# Patient Record
Sex: Male | Born: 1955 | Race: White | Hispanic: No | Marital: Married | Smoking: Never smoker
Health system: Southern US, Community
[De-identification: ages and names within clinical notes are randomized; demographics above are authoritative.]

## PROBLEM LIST (undated history)

## (undated) DIAGNOSIS — N200 Calculus of kidney: Secondary | ICD-10-CM

## (undated) DIAGNOSIS — E0590 Thyrotoxicosis, unspecified without thyrotoxic crisis or storm: Secondary | ICD-10-CM

## (undated) DIAGNOSIS — I10 Essential (primary) hypertension: Secondary | ICD-10-CM

## (undated) HISTORY — PX: SHOULDER SURGERY: SHX246

---

## 2000-04-05 ENCOUNTER — Encounter: Payer: Self-pay | Admitting: Family Medicine

## 2000-04-05 ENCOUNTER — Ambulatory Visit (HOSPITAL_COMMUNITY): Admission: RE | Admit: 2000-04-05 | Discharge: 2000-04-05 | Payer: Self-pay | Admitting: Family Medicine

## 2000-04-12 ENCOUNTER — Encounter: Admission: RE | Admit: 2000-04-12 | Discharge: 2000-04-12 | Payer: Self-pay | Admitting: Family Medicine

## 2000-04-12 ENCOUNTER — Encounter: Payer: Self-pay | Admitting: Family Medicine

## 2002-02-04 ENCOUNTER — Encounter: Admission: RE | Admit: 2002-02-04 | Discharge: 2002-02-04 | Payer: Self-pay | Admitting: Family Medicine

## 2002-02-04 ENCOUNTER — Encounter: Payer: Self-pay | Admitting: Family Medicine

## 2002-02-06 ENCOUNTER — Encounter: Payer: Self-pay | Admitting: Family Medicine

## 2002-02-06 ENCOUNTER — Encounter: Admission: RE | Admit: 2002-02-06 | Discharge: 2002-02-06 | Payer: Self-pay | Admitting: Family Medicine

## 2003-06-04 ENCOUNTER — Emergency Department (HOSPITAL_COMMUNITY): Admission: EM | Admit: 2003-06-04 | Discharge: 2003-06-04 | Payer: Self-pay | Admitting: Emergency Medicine

## 2011-10-13 ENCOUNTER — Emergency Department (HOSPITAL_COMMUNITY)
Admission: EM | Admit: 2011-10-13 | Discharge: 2011-10-13 | Disposition: A | Discharge status: Home / Self Care | Payer: BC Managed Care – PPO | Attending: Emergency Medicine | Admitting: Emergency Medicine

## 2011-10-13 ENCOUNTER — Encounter (HOSPITAL_COMMUNITY): Payer: Self-pay | Admitting: Emergency Medicine

## 2011-10-13 DIAGNOSIS — M545 Low back pain: Secondary | ICD-10-CM

## 2011-10-13 DIAGNOSIS — E0590 Thyrotoxicosis, unspecified without thyrotoxic crisis or storm: Secondary | ICD-10-CM | POA: Insufficient documentation

## 2011-10-13 DIAGNOSIS — M5126 Other intervertebral disc displacement, lumbar region: Secondary | ICD-10-CM | POA: Insufficient documentation

## 2011-10-13 HISTORY — DX: Calculus of kidney: N20.0

## 2011-10-13 HISTORY — DX: Thyrotoxicosis, unspecified without thyrotoxic crisis or storm: E05.90

## 2011-10-13 MED ORDER — LORAZEPAM 2 MG/ML IJ SOLN
1.0000 mg | Freq: Once | INTRAMUSCULAR | Status: AC
  Administered 2011-10-13: 1 mg via INTRAMUSCULAR
  Filled 2011-10-13: qty 1

## 2011-10-13 MED ORDER — HYDROMORPHONE HCL PF 2 MG/ML IJ SOLN
2.0000 mg | Freq: Once | INTRAMUSCULAR | Status: AC
  Administered 2011-10-13: 2 mg via INTRAMUSCULAR
  Filled 2011-10-13: qty 1

## 2011-10-13 MED ORDER — DIAZEPAM 5 MG PO TABS: 5.0000 mg | ORAL_TABLET | Freq: Four times a day (QID) | ORAL | Status: AC | PRN

## 2011-10-13 MED ORDER — OXYCODONE-ACETAMINOPHEN 5-325 MG PO TABS: 2.0000 | ORAL_TABLET | ORAL | Status: AC | PRN

## 2011-10-13 NOTE — ED Provider Notes (Signed)
History   This chart was scribed for Hurman Horn, MD by Melba Coon. The patient was seen in room TR11C/TR11C and the patient's care was started at 5:05PM.    CSN: [id number]  Arrival date & time 10/13/11  1455   None     Chief Complaint  Patient presents with  . Back Pain    (Consider location/radiation/quality/duration/timing/severity/associated sxs/prior treatment) The history is provided by the patient. No language interpreter was used.   Howard Parks is a 56 y.o. male who presents to the Emergency Department complaining of constant, moderate to severe, non-radiating lower back pain with an onset about 4 hours ago. Pt was golfing today and may have pulled a muscle; felt a spasm. Heat, ice, and ibuprofen did not alleviate the symptoms. Certain body positions aggravate the pain. No HA, fever, neck pain, sore throat, rash, back pain, CP, SOB, abd pain, n/v/d, dysuria, bowel or bladder dysfunction, or extremity pain, edema, weakness, numbness, or tingling. No IV drug use. Allergic to Nyquil; Ampicillin; and Penicillins. No other pertinent medical symptoms.   Past Medical History  Diagnosis Date  . Kidney stones   . Hyperthyroidism     Past Surgical History  Procedure Date  . Shoulder surgery     No family history on file.  History  Substance Use Topics  . Smoking status: Never Smoker   . Smokeless tobacco: Not on file  . Alcohol Use: Yes      Review of Systems 10 Systems reviewed and all are negative for acute change except as noted in the HPI.   Allergies  Nyquil; Ampicillin; and Penicillins  Home Medications   Current Outpatient Rx  Name Route Sig Dispense Refill  . IBUPROFEN 800 MG PO TABS Oral Take 800 mg by mouth every 8 (eight) hours as needed.    Marland Kitchen LEVOTHYROXINE SODIUM 75 MCG PO TABS Oral Take 75 mcg by mouth daily.    Marland Kitchen DIAZEPAM 5 MG PO TABS Oral Take 1 tablet (5 mg total) by mouth every 6 (six) hours as needed (spasms). 10 tablet 0  .  OXYCODONE-ACETAMINOPHEN 5-325 MG PO TABS Oral Take 2 tablets by mouth every 4 (four) hours as needed for pain. 20 tablet 0    BP 137/70  Pulse 70  Temp 98.1 F (36.7 C) (Oral)  Resp 18  SpO2 95%  Physical Exam  Nursing note and vitals reviewed. Constitutional:       Awake, alert, nontoxic appearance with baseline speech.  HENT:  Head: Atraumatic.  Eyes: Pupils are equal, round, and reactive to light. Right eye exhibits no discharge. Left eye exhibits no discharge.  Neck: Neck supple.  Cardiovascular: Normal rate and regular rhythm.   No murmur heard. Pulmonary/Chest: Effort normal and breath sounds normal. No respiratory distress. He has no wheezes. He has no rales. He exhibits no tenderness.  Abdominal: Soft. Bowel sounds are normal. He exhibits no mass. There is no tenderness. There is no rebound.  Musculoskeletal:       Thoracic back: He exhibits no tenderness.       Lumbar back: He exhibits no tenderness.       Bilateral lower extremities non tender without new rashes or color change, baseline ROM with intact DP / PT pulses, CR<2 secs all digits bilaterally, sensation baseline light touch bilaterally for pt, DTR's symmetric and intact bilaterally KJ / AJ, motor symmetric bilateral 5 / 5 hip flexion, quadriceps, hamstrings, EHL, foot dorsiflexion, foot plantarflexion, gait somewhat antalgic but without apparent  new ataxia.  Neurological:       Mental status baseline for patient.  Upper extremity motor strength and sensation intact and symmetric bilaterally.  Skin: No rash noted.  Psychiatric: He has a normal mood and affect.    ED Course  Procedures (including critical care time)  DIAGNOSTIC STUDIES: Oxygen Saturation is 95% on room air, adequate by my interpretation.    COORDINATION OF CARE:  5:14PM - pain meds and muscle relaxants will be Rx for the pt; pt advised to f/u with PCP; pt ready for d/c.   Labs Reviewed - No data to display No results found.   1. Lumbar  pain   2. Lumbar herniated disc       MDM   I personally performed the services described in this documentation, which was scribed in my presence. The recorded information has been reviewed and considered.  Patient / Family / Caregiver informed of clinical course, understand medical decision-making process, and agree with plan.       Hurman Horn, MD 10/20/11 [phone]

## 2011-10-13 NOTE — ED Notes (Signed)
Pt c/o low back pain onset today about 3 hours ago while golfing. Pt tried heat and ice and ibuprofen without relief.

## 2012-05-28 ENCOUNTER — Emergency Department (HOSPITAL_COMMUNITY)
Admission: EM | Admit: 2012-05-28 | Discharge: 2012-05-28 | Disposition: A | Discharge status: Home / Self Care | Payer: BC Managed Care – PPO | Attending: Emergency Medicine | Admitting: Emergency Medicine

## 2012-05-28 ENCOUNTER — Encounter (HOSPITAL_COMMUNITY): Payer: Self-pay | Admitting: Adult Health

## 2012-05-28 DIAGNOSIS — R197 Diarrhea, unspecified: Secondary | ICD-10-CM | POA: Insufficient documentation

## 2012-05-28 DIAGNOSIS — E0590 Thyrotoxicosis, unspecified without thyrotoxic crisis or storm: Secondary | ICD-10-CM | POA: Insufficient documentation

## 2012-05-28 DIAGNOSIS — Z87442 Personal history of urinary calculi: Secondary | ICD-10-CM | POA: Insufficient documentation

## 2012-05-28 DIAGNOSIS — R112 Nausea with vomiting, unspecified: Secondary | ICD-10-CM | POA: Insufficient documentation

## 2012-05-28 DIAGNOSIS — Z79899 Other long term (current) drug therapy: Secondary | ICD-10-CM | POA: Insufficient documentation

## 2012-05-28 LAB — POCT I-STAT, CHEM 8
BUN: 17 mg/dL (ref 6–23)
Calcium, Ion: 1.14 mmol/L (ref 1.12–1.23)
Hemoglobin: 16.7 g/dL (ref 13.0–17.0)
Sodium: 144 mEq/L (ref 135–145)
TCO2: 28 mmol/L (ref 0–100)

## 2012-05-28 MED ORDER — SODIUM CHLORIDE 0.9 % IV BOLUS (SEPSIS)
1000.0000 mL | Freq: Once | INTRAVENOUS | Status: AC
  Administered 2012-05-28: 1000 mL via INTRAVENOUS

## 2012-05-28 MED ORDER — ONDANSETRON HCL 8 MG PO TABS: 8.0000 mg | ORAL_TABLET | Freq: Three times a day (TID) | ORAL | Status: AC | PRN

## 2012-05-28 MED ORDER — ONDANSETRON HCL 4 MG/2ML IJ SOLN
4.0000 mg | Freq: Once | INTRAMUSCULAR | Status: AC
  Administered 2012-05-28: 4 mg via INTRAVENOUS
  Filled 2012-05-28: qty 2

## 2012-05-28 NOTE — ED Notes (Signed)
Presents with diarrhea since Sunday. Today nausea and vomiting began associated with abdominal cramping.  4 episodes of vomiting today, 15 bouts of loose stool per day since Sunday. Denies blood in stool, reports some black stools.

## 2012-05-28 NOTE — ED Notes (Signed)
Wife called looking for rx. Rx was printed and given to patient per EPIC record.

## 2012-05-28 NOTE — ED Provider Notes (Signed)
History     CSN: [id number]  Arrival date & time 05/28/12  0301   First MD Initiated Contact with Patient 05/28/12 [phone]      Chief Complaint  Patient presents with  . Diarrhea    (Consider location/radiation/quality/duration/timing/severity/associated sxs/prior treatment) HPI This is a 57 year old male with a three-day history of diarrhea. He states the diarrhea was initially clear but has become darker since. It was initially associated with abdominal cramping but that has resolved. He thought it was getting better but yesterday evening about 10 PM developed nausea and vomiting. He has vomited about 4 times. The severity is such that he's been having about 15 loose stools daily for the past 3 days. He has not noticed any blood in the stool. He feels weak, lightheaded and has a dry mouth. He tried taking Imodium earlier but threw it back up.  Past Medical History  Diagnosis Date  . Kidney stones   . Hyperthyroidism     Past Surgical History  Procedure Laterality Date  . Shoulder surgery      History reviewed. No pertinent family history.  History  Substance Use Topics  . Smoking status: Never Smoker   . Smokeless tobacco: Not on file  . Alcohol Use: Yes      Review of Systems  All other systems reviewed and are negative.    Allergies  Nyquil; Ampicillin; and Penicillins  Home Medications   Current Outpatient Rx  Name  Route  Sig  Dispense  Refill  . acetaminophen (TYLENOL) 325 MG tablet   Oral   Take 650 mg by mouth every 6 (six) hours as needed for pain.         Marland Kitchen levothyroxine (SYNTHROID, LEVOTHROID) 125 MCG tablet   Oral   Take 125 mcg by mouth daily.         Marland Kitchen loperamide (IMODIUM A-D) 2 MG tablet   Oral   Take 2 mg by mouth 4 (four) times daily as needed for diarrhea or loose stools.           BP 150/77  Pulse 79  Temp(Src) 98.1 F (36.7 C) (Oral)  Resp 18  SpO2 96%  Physical Exam General: Well-developed, well-nourished male in no  acute distress; appearance consistent with age of record HENT: normocephalic, atraumatic; mucous membranes dry Eyes: pupils equal round and reactive to light; extraocular muscles intact Neck: supple Heart: regular rate and rhythm Lungs: clear to auscultation bilaterally Abdomen: soft; nondistended; nontender; no masses or hepatosplenomegaly; bowel sounds present Extremities: No deformity; full range of motion; pulses normal Neurologic: Awake, alert and oriented; motor function intact in all extremities and symmetric; no facial droop Skin: Warm and dry     ED Course  Procedures (including critical care time)    MDM   Nursing notes and vitals signs, including pulse oximetry, reviewed.  Summary of this visit's results, reviewed by myself:  Labs:  Results for orders placed during the hospital encounter of 05/28/12 (from the past 24 hour(s))  POCT I-STAT, CHEM 8     Status: Abnormal   Collection Time    05/28/12  4:07 AM      Result Value Range   Sodium 144  135 - 145 mEq/L   Potassium 4.1  3.5 - 5.1 mEq/L   Chloride 107  96 - 112 mEq/L   BUN 17  6 - 23 mg/dL   Creatinine, Ser 1.19  0.50 - 1.35 mg/dL   Glucose, Bld 147 (*) 70 - 99 mg/dL  Calcium, Ion 1.14  1.12 - 1.23 mmol/L   TCO2 28  0 - 100 mmol/L   Hemoglobin 16.7  13.0 - 17.0 g/dL   HCT 09.8  11.9 - 14.7 %    5:34 AM Fields much better after 2 L of normal saline and Zofran IV.         Hanley Seamen, MD 05/28/12 [phone]

## 2013-05-26 ENCOUNTER — Emergency Department (HOSPITAL_COMMUNITY)
Admission: EM | Admit: 2013-05-26 | Discharge: 2013-05-26 | Disposition: A | Discharge status: Home / Self Care | Payer: BC Managed Care – PPO | Attending: Emergency Medicine | Admitting: Emergency Medicine

## 2013-05-26 ENCOUNTER — Encounter (HOSPITAL_COMMUNITY): Payer: Self-pay | Admitting: Emergency Medicine

## 2013-05-26 ENCOUNTER — Emergency Department (HOSPITAL_COMMUNITY): Payer: BC Managed Care – PPO

## 2013-05-26 DIAGNOSIS — Z862 Personal history of diseases of the blood and blood-forming organs and certain disorders involving the immune mechanism: Secondary | ICD-10-CM | POA: Insufficient documentation

## 2013-05-26 DIAGNOSIS — R112 Nausea with vomiting, unspecified: Secondary | ICD-10-CM | POA: Insufficient documentation

## 2013-05-26 DIAGNOSIS — Z8639 Personal history of other endocrine, nutritional and metabolic disease: Secondary | ICD-10-CM | POA: Insufficient documentation

## 2013-05-26 DIAGNOSIS — R197 Diarrhea, unspecified: Secondary | ICD-10-CM | POA: Insufficient documentation

## 2013-05-26 DIAGNOSIS — Z87442 Personal history of urinary calculi: Secondary | ICD-10-CM | POA: Insufficient documentation

## 2013-05-26 DIAGNOSIS — Z880 Allergy status to penicillin: Secondary | ICD-10-CM | POA: Insufficient documentation

## 2013-05-26 DIAGNOSIS — R079 Chest pain, unspecified: Secondary | ICD-10-CM | POA: Insufficient documentation

## 2013-05-26 DIAGNOSIS — Z79899 Other long term (current) drug therapy: Secondary | ICD-10-CM | POA: Insufficient documentation

## 2013-05-26 LAB — BASIC METABOLIC PANEL
BUN: 13 mg/dL (ref 6–23)
CHLORIDE: 103 meq/L (ref 96–112)
CO2: 26 meq/L (ref 19–32)
CREATININE: 1.03 mg/dL (ref 0.50–1.35)
Calcium: 8.9 mg/dL (ref 8.4–10.5)
GFR calc non Af Amer: 79 mL/min — ABNORMAL LOW (ref >90)
GLUCOSE: 148 mg/dL — AB (ref 70–99)
Potassium: 4.8 mEq/L (ref 3.7–5.3)
Sodium: 142 mEq/L (ref 137–147)

## 2013-05-26 LAB — CBC WITH DIFFERENTIAL/PLATELET
BASOS PCT: 0 % (ref 0–1)
Basophils Absolute: 0 10*3/uL (ref 0.0–0.1)
Eosinophils Absolute: 0.1 10*3/uL (ref 0.0–0.7)
Eosinophils Relative: 1 % (ref 0–5)
HEMATOCRIT: 45.4 % (ref 39.0–52.0)
HEMOGLOBIN: 15.9 g/dL (ref 13.0–17.0)
Lymphocytes Relative: 8 % — ABNORMAL LOW (ref 12–46)
Lymphs Abs: 0.7 10*3/uL (ref 0.7–4.0)
MCH: 29.5 pg (ref 26.0–34.0)
MCHC: 35 g/dL (ref 30.0–36.0)
MCV: 84.2 fL (ref 78.0–100.0)
MONO ABS: 0.7 10*3/uL (ref 0.1–1.0)
MONOS PCT: 8 % (ref 3–12)
NEUTROS ABS: 7.3 10*3/uL (ref 1.7–7.7)
Neutrophils Relative %: 83 % — ABNORMAL HIGH (ref 43–77)
Platelets: 191 10*3/uL (ref 150–400)
RBC: 5.39 MIL/uL (ref 4.22–5.81)
RDW: 13.4 % (ref 11.5–15.5)
WBC: 8.8 10*3/uL (ref 4.0–10.5)

## 2013-05-26 LAB — HEPATIC FUNCTION PANEL
ALBUMIN: 4 g/dL (ref 3.5–5.2)
ALK PHOS: 76 U/L (ref 39–117)
ALT: 30 U/L (ref 0–53)
AST: 31 U/L (ref 0–37)
BILIRUBIN TOTAL: 0.7 mg/dL (ref 0.3–1.2)
Bilirubin, Direct: <0.2 mg/dL (ref 0.0–0.3)
Total Protein: 7.4 g/dL (ref 6.0–8.3)

## 2013-05-26 LAB — I-STAT TROPONIN, ED: Troponin i, poc: 0 ng/mL (ref 0.00–0.08)

## 2013-05-26 MED ORDER — PROMETHAZINE HCL 25 MG PO TABS: 25.0000 mg | ORAL_TABLET | Freq: Four times a day (QID) | ORAL | Status: AC | PRN

## 2013-05-26 MED ORDER — PROMETHAZINE HCL 25 MG/ML IJ SOLN
12.5000 mg | Freq: Once | INTRAMUSCULAR | Status: AC
  Administered 2013-05-26: 12.5 mg via INTRAVENOUS
  Filled 2013-05-26: qty 1

## 2013-05-26 MED ORDER — IOHEXOL 350 MG/ML SOLN
80.0000 mL | Freq: Once | INTRAVENOUS | Status: AC | PRN
  Administered 2013-05-26: 80 mL via INTRAVENOUS

## 2013-05-26 MED ORDER — SODIUM CHLORIDE 0.9 % IV BOLUS (SEPSIS): 1000.0000 mL | Freq: Once | INTRAVENOUS | Status: DC

## 2013-05-26 MED ORDER — MORPHINE SULFATE 4 MG/ML IJ SOLN
4.0000 mg | Freq: Once | INTRAMUSCULAR | Status: AC
  Administered 2013-05-26: 4 mg via INTRAVENOUS
  Filled 2013-05-26: qty 1

## 2013-05-26 MED ORDER — ONDANSETRON HCL 4 MG/2ML IJ SOLN
4.0000 mg | Freq: Once | INTRAMUSCULAR | Status: AC
  Administered 2013-05-26: 4 mg via INTRAVENOUS
  Filled 2013-05-26: qty 2

## 2013-05-26 MED ORDER — SODIUM CHLORIDE 0.9 % IV BOLUS (SEPSIS)
2000.0000 mL | Freq: Once | INTRAVENOUS | Status: AC
  Administered 2013-05-26: 2000 mL via INTRAVENOUS

## 2013-05-26 NOTE — ED Notes (Signed)
Pt states he has "over extended himself" and become very dehydrated.  Pt states the vomiting started Sunday and the diarrhea started midnight last night.  Pt states he is in the process of moving and has not been drinking many fluids.  Denies being around anyone sick.

## 2013-05-26 NOTE — ED Provider Notes (Signed)
CSN: [id number]     Arrival date & time 05/26/13  0610 History   First MD Initiated Contact with Patient 05/26/13 [phone]     Chief Complaint  Patient presents with  . Nausea  . Emesis  . Diarrhea     (Consider location/radiation/quality/duration/timing/severity/associated sxs/prior Treatment) Patient is a 58 y.o. male presenting with vomiting and diarrhea. The history is provided by the patient and the spouse. No language interpreter was used.  Emesis Associated symptoms: diarrhea   Associated symptoms: no abdominal pain, no chills and no sore throat   Diarrhea Associated symptoms: vomiting   Associated symptoms: no abdominal pain, no chills and no fever    This is a 58yo WM w/ PMH hypothyroidism and nephrolithiasis who presents with 2 day history of N/V/D. Patient notes the N/V began Sunday and since then he has vomited twice per day, NBNB. He began having nonbloody, watery diarrhea since midnight last night. He describes having some dull chest pain only with retching that began yesterday, denies SOB, diaphoresis or radiation of the pain. Pt has been able to keep down small amount of broth and a banana yesterday, but states he feels dehydrated and is thirsty. He saw his PCP yesterday and was given PO zofran, but he notes he was only able to keep one of these pills down. He is currently nauseated. Denies abd pain, dysuria, F/C. No sick contacts.  Past Medical History  Diagnosis Date  . Kidney stones   . Hyperthyroidism    Past Surgical History  Procedure Laterality Date  . Shoulder surgery     History reviewed. No pertinent family history. History  Substance Use Topics  . Smoking status: Never Smoker   . Smokeless tobacco: Not on file  . Alcohol Use: Yes    Review of Systems  Constitutional: Negative for fever and chills.  HENT: Negative for sore throat.   Respiratory: Negative for cough and shortness of breath.   Cardiovascular: Positive for chest pain. Negative for  palpitations and leg swelling.  Gastrointestinal: Positive for nausea, vomiting and diarrhea. Negative for abdominal pain and blood in stool.  Genitourinary: Negative for dysuria.  Neurological: Negative for light-headedness.  All other systems reviewed and are negative.      Allergies  Nyquil; Ampicillin; and Penicillins  Home Medications   Current Outpatient Rx  Name  Route  Sig  Dispense  Refill  . levothyroxine (SYNTHROID, LEVOTHROID) 125 MCG tablet   Oral   Take 125 mcg by mouth daily.         . ondansetron (ZOFRAN) 8 MG tablet   Oral   Take 1 tablet (8 mg total) by mouth every 8 (eight) hours as needed for nausea.   10 tablet   0    BP 137/75  Pulse 80  Temp(Src) 98.1 F (36.7 C) (Oral)  Resp 16  SpO2 92% Physical Exam  Nursing note and vitals reviewed. Constitutional: He is oriented to person, place, and time. He appears well-developed and well-nourished. No distress.  HENT:  Head: Normocephalic and atraumatic.  Mouth/Throat: Oropharynx is clear and moist.  Eyes: Conjunctivae and EOM are normal. Pupils are equal, round, and reactive to light.  Neck: Neck supple.  Cardiovascular: Normal rate, regular rhythm and normal heart sounds.   Pulmonary/Chest: Effort normal.  Abdominal: Soft. Bowel sounds are normal. He exhibits no distension. There is no tenderness.  Musculoskeletal: He exhibits no edema.  Neurological: He is alert and oriented to person, place, and time. No cranial nerve deficit.  Skin: Skin is warm and dry. He is not diaphoretic.    ED Course  Procedures (including critical care time) Labs Review Labs Reviewed  CBC WITH DIFFERENTIAL - Abnormal; Notable for the following:    Neutrophils Relative % 83 (*)    Lymphocytes Relative 8 (*)    All other components within normal limits  BASIC METABOLIC PANEL  I-STAT TROPOININ, ED   Imaging Review No results found.   EKG Interpretation None      MDM   This is a 58yo WM w/ PMH  nephrolithiasis and hypothyroidism who presents w/ 2 day hx N/V/D. Patient is nontoxic appearing with stable vital signs. His symptoms most likely represent acute gastroenteritis, though will obtain basic lab work. Patient had some chest pain w/ retching, which is most likely MSK in origin. ACS much less likely as he had no associated typical symptoms of ACS, troponin x 1 negative and EKG looks normal. Will give 1L NS and IV zofran now for symptom control.  10:51 AM Patient still nauseated after zofran, will give dose of phenergan. Patient states he is scheduled to obtain CXR next week as per request of his PCP for intermittent CP he has been having recently. He does not currently have chest pain, but we will go ahead and obtain CXR since patient is already here so he does not have to come back.   11:42 AM CXR showed small area of atelectasis vs PNA in L lower lobe. Patient does not have symptoms of pneumonia. I am concerned by these xray findings in an asymptomatic individual. I discussed the results with the family. I recommended further evaluation with chest CTA. They are in agreement with this plan.  1:36 PM CTA chest showed vascular prominence w/ mediastinal fat. No pulmonary edema, PE, consolidation, adenopathy. Patient is feeling much better and feels ready for discharge. I provided patient with a prescription for phenergan to go home with. I also gave him a copy of his lab work, CXR, and Chest CTA.   Windell Hummingbird, MD 05/26/13 [phone]

## 2013-05-26 NOTE — Discharge Instructions (Signed)
Viral Gastroenteritis °Viral gastroenteritis is also known as stomach flu. This condition affects the stomach and intestinal tract. It can cause sudden diarrhea and vomiting. The illness typically lasts 3 to 8 days. Most people develop an immune response that eventually gets rid of the virus. While this natural response develops, the virus can make you quite ill. °CAUSES  °Many different viruses can cause gastroenteritis, such as rotavirus or noroviruses. You can catch one of these viruses by consuming contaminated food or water. You may also catch a virus by sharing utensils or other personal items with an infected person or by touching a contaminated surface. °SYMPTOMS  °The most common symptoms are diarrhea and vomiting. These problems can cause a severe loss of body fluids (dehydration) and a body salt (electrolyte) imbalance. Other symptoms may include: °· Fever. °· Headache. °· Fatigue. °· Abdominal pain. °DIAGNOSIS  °Your caregiver can usually diagnose viral gastroenteritis based on your symptoms and a physical exam. A stool sample may also be taken to test for the presence of viruses or other infections. °TREATMENT  °This illness typically goes away on its own. Treatments are aimed at rehydration. The most serious cases of viral gastroenteritis involve vomiting so severely that you are not able to keep fluids down. In these cases, fluids must be given through an intravenous line (IV). °HOME CARE INSTRUCTIONS  °· Drink enough fluids to keep your urine clear or pale yellow. Drink small amounts of fluids frequently and increase the amounts as tolerated. °· Ask your caregiver for specific rehydration instructions. °· Avoid: °· Foods high in sugar. °· Alcohol. °· Carbonated drinks. °· Tobacco. °· Juice. °· Caffeine drinks. °· Extremely hot or cold fluids. °· Fatty, greasy foods. °· Too much intake of anything at one time. °· Dairy products until 24 to 48 hours after diarrhea stops. °· You may consume probiotics.  Probiotics are active cultures of beneficial bacteria. They may lessen the amount and number of diarrheal stools in adults. Probiotics can be found in yogurt with active cultures and in supplements. °· Wash your hands well to avoid spreading the virus. °· Only take over-the-counter or prescription medicines for pain, discomfort, or fever as directed by your caregiver. Do not give aspirin to children. Antidiarrheal medicines are not recommended. °· Ask your caregiver if you should continue to take your regular prescribed and over-the-counter medicines. °· Keep all follow-up appointments as directed by your caregiver. °SEEK IMMEDIATE MEDICAL CARE IF:  °· You are unable to keep fluids down. °· You do not urinate at least once every 6 to 8 hours. °· You develop shortness of breath. °· You notice blood in your stool or vomit. This may look like coffee grounds. °· You have abdominal pain that increases or is concentrated in one small area (localized). °· You have persistent vomiting or diarrhea. °· You have a fever. °· The patient is a child younger than 3 months, and he or she has a fever. °· The patient is a child older than 3 months, and he or she has a fever and persistent symptoms. °· The patient is a child older than 3 months, and he or she has a fever and symptoms suddenly get worse. °· The patient is a baby, and he or she has no tears when crying. °MAKE SURE YOU:  °· Understand these instructions. °· Will watch your condition. °· Will get help right away if you are not doing well or get worse. °Document Released: 02/26/2005 Document Revised: 05/21/2011 Document Reviewed: 12/13/2010 °  Exit[employer] Patient Information 2014 [employer], [place]. Diarrhea Diarrhea is frequent loose and watery bowel movements. It can cause you to feel weak and dehydrated. Dehydration can cause you to become tired and thirsty, have a dry mouth, and have decreased urination that often is dark yellow. Diarrhea is a sign of another problem, most  often an infection that will not last long. In most cases, diarrhea typically lasts 2 3 days. However, it can last longer if it is a sign of something more serious. It is important to treat your diarrhea as directed by your caregive to lessen or prevent future episodes of diarrhea. CAUSES  Some common causes include:  Gastrointestinal infections caused by viruses, bacteria, or parasites.  Food poisoning or food allergies.  Certain medicines, such as antibiotics, chemotherapy, and laxatives.  Artificial sweeteners and fructose.  Digestive disorders. HOME CARE INSTRUCTIONS  Ensure adequate fluid intake (hydration): have 1 cup (8 oz) of fluid for each diarrhea episode. Avoid fluids that contain simple sugars or sports drinks, fruit juices, whole milk products, and sodas. Your urine should be clear or pale yellow if you are drinking enough fluids. Hydrate with an oral rehydration solution that you can purchase at pharmacies, retail stores, and online. You can prepare an oral rehydration solution at home by mixing the following ingredients together:    tsp table salt.   tsp baking soda.   tsp salt substitute containing potassium chloride.  1  tablespoons sugar.  1 L (34 oz) of water.  Certain foods and beverages may increase the speed at which food moves through the gastrointestinal (GI) tract. These foods and beverages should be avoided and include:  Caffeinated and alcoholic beverages.  High-fiber foods, such as raw fruits and vegetables, nuts, seeds, and whole grain breads and cereals.  Foods and beverages sweetened with sugar alcohols, such as xylitol, sorbitol, and mannitol.  Some foods may be well tolerated and may help thicken stool including:  Starchy foods, such as rice, toast, pasta, low-sugar cereal, oatmeal, grits, baked potatoes, crackers, and bagels.  Bananas.  Applesauce.  Add probiotic-rich foods to help increase healthy bacteria in the GI tract, such as yogurt  and fermented milk products.  Wash your hands well after each diarrhea episode.  Only take over-the-counter or prescription medicines as directed by your caregiver.  Take a warm bath to relieve any burning or pain from frequent diarrhea episodes. SEEK IMMEDIATE MEDICAL CARE IF:   You are unable to keep fluids down.  You have persistent vomiting.  You have blood in your stool, or your stools are black and tarry.  You do not urinate in 6 8 hours, or there is only a small amount of very dark urine.  You have abdominal pain that increases or localizes.  You have weakness, dizziness, confusion, or lightheadedness.  You have a severe headache.  Your diarrhea gets worse or does not get better.  You have a fever or persistent symptoms for more than 2 3 days.  You have a fever and your symptoms suddenly get worse. MAKE SURE YOU:   Understand these instructions.  Will watch your condition.  Will get help right away if you are not doing well or get worse. Document Released: 02/16/2002 Document Revised: 02/13/2012 Document Reviewed: 11/04/2011 [facility]Exit[facility] Patient Information 2014 [place], [place].

## 2013-05-26 NOTE — ED Provider Notes (Signed)
Medical screening examination/treatment/procedure(s) were conducted as a shared visit with non-physician practitioner(s) and myself.  I personally evaluated the patient during the encounter.   EKG Interpretation   Date/Time:  Tuesday May 26 2013 06:18:57 EDT Ventricular Rate:  86 PR Interval:  142 QRS Duration: 72 QT Interval:  352 QTC Calculation: 421 R Axis:   54 Text Interpretation:  Normal sinus rhythm Normal ECG No old tracing to  compare Confirmed by Tamyia Minich  MD, Caryn Bee ([id number]) on 05/26/2013 7:35:55 AM      Likely viral gastroenteritis. Abnormal cxr, CT performed to evaluate for mass, none of which was found. Feels better. pcp follow up  Dg Chest 2 View  05/26/2013   CLINICAL DATA:  A 3 day history of nausea and vomiting with recent chest pain  EXAM: CHEST  2 VIEW  COMPARISON:  None.  FINDINGS: The lungs are borderline hypoinflated. There are coarse retrocardiac lung markings in the left lower lobe. There is no pleural effusion or pneumothorax. The cardiac silhouette is top-normal in size. The pulmonary vascularity is not engorged. The mediastinum is normal in width. The trachea is midline. The observed portions of the bony thorax appear normal.  IMPRESSION: Increased density in the left lower lobe is consistent with atelectasis or pneumonia. There is no evidence of CHF nor other acute cardiopulmonary abnormality.   Electronically Signed   By: David  [place]   On: 05/26/2013 11:28   Ct Angio Chest Pe W/cm &/or Wo Cm  05/26/2013   CLINICAL DATA:  Abnormal chest radiograph; chest pain  EXAM: CT ANGIOGRAPHY CHEST WITH CONTRAST  TECHNIQUE: Multidetector CT imaging of the chest was performed using the standard protocol during bolus administration of intravenous contrast. Multiplanar CT image reconstructions and MIPs were obtained to evaluate the vascular anatomy.  CONTRAST:  80mL OMNIPAQUE IOHEXOL 350 MG/ML SOLN  COMPARISON:  Chest radiograph May 26, 2013  FINDINGS: There is no demonstrable  pulmonary embolus. There is no thoracic aortic aneurysm or dissection.  There is no edema or consolidation. There is prominent mediastinal fat along the anterior heart border or on both right and left sides.  There is no appreciable thoracic adenopathy. The pericardium is not thickened.  In the visualized upper abdomen, there is fatty change in the liver. There is incomplete visualization of an apparent cyst arising from the upper pole the left kidney. There are no blastic or lytic bone lesions.  Review of the MIP images confirms the above findings.  IMPRESSION: The opacity on recent chest radiograph is felt to represent vascular prominence coupled with mediastinal fat. There is no edema or consolidation. No demonstrable pulmonary embolus. There is evidence of fatty change in the liver.   Electronically Signed   By: Bretta Bang M.D.   On: 05/26/2013 13:18  I personally reviewed the imaging tests through PACS system I reviewed available ER/hospitalization records through the EMR  Results for orders placed during the hospital encounter of 05/26/13  CBC WITH DIFFERENTIAL      Result Value Ref Range   WBC 8.8  4.0 - 10.5 K/uL   RBC 5.39  4.22 - 5.81 MIL/uL   Hemoglobin 15.9  13.0 - 17.0 g/dL   HCT 60.4  54.0 - 98.1 %   MCV 84.2  78.0 - 100.0 fL   MCH 29.5  26.0 - 34.0 pg   MCHC 35.0  30.0 - 36.0 g/dL   RDW 19.1  47.8 - 29.5 %   Platelets 191  150 - 400 K/uL  Neutrophils Relative % 83 (*) 43 - 77 %   Neutro Abs 7.3  1.7 - 7.7 K/uL   Lymphocytes Relative 8 (*) 12 - 46 %   Lymphs Abs 0.7  0.7 - 4.0 K/uL   Monocytes Relative 8  3 - 12 %   Monocytes Absolute 0.7  0.1 - 1.0 K/uL   Eosinophils Relative 1  0 - 5 %   Eosinophils Absolute 0.1  0.0 - 0.7 K/uL   Basophils Relative 0  0 - 1 %   Basophils Absolute 0.0  0.0 - 0.1 K/uL  BASIC METABOLIC PANEL      Result Value Ref Range   Sodium 142  137 - 147 mEq/L   Potassium 4.8  3.7 - 5.3 mEq/L   Chloride 103  96 - 112 mEq/L   CO2 26  19 - 32  mEq/L   Glucose, Bld 148 (*) 70 - 99 mg/dL   BUN 13  6 - 23 mg/dL   Creatinine, Ser 1.471.03  0.50 - 1.35 mg/dL   Calcium 8.9  8.4 - 82.910.5 mg/dL   GFR calc non Af Amer 79 (*) >90 mL/min   GFR calc Af Amer >90  >90 mL/min  HEPATIC FUNCTION PANEL      Result Value Ref Range   Total Protein 7.4  6.0 - 8.3 g/dL   Albumin 4.0  3.5 - 5.2 g/dL   AST 31  0 - 37 U/L   ALT 30  0 - 53 U/L   Alkaline Phosphatase 76  39 - 117 U/L   Total Bilirubin 0.7  0.3 - 1.2 mg/dL   Bilirubin, Direct <5.6<0.2  0.0 - 0.3 mg/dL   Indirect Bilirubin NOT CALCULATED  0.3 - 0.9 mg/dL  I-STAT TROPOININ, ED      Result Value Ref Range   Troponin i, poc 0.00  0.00 - 0.08 ng/mL   Comment 3              Lyanne CoKevin M Tilden Broz, MD 05/26/13 1644

## 2013-05-26 NOTE — ED Notes (Addendum)
Patient with N/V/D for last two days.  Patient states he has been packing and has not been taking good oral intake.  Patient also states he has been having some tightness in his chest, especially when vomiting.

## 2013-05-26 NOTE — ED Notes (Addendum)
Pt states he saw his PCP yesterday and got a prescription for nausea medicine but he has still been vomiting and had diarrhea last night. Pt states this has happened before when he over exerted himself and it went away when he came in and got 2 bags of saline. Pt states that he has been able to drink fluids.

## 2015-06-02 IMAGING — CT CT ANGIO CHEST
2 of 7 series · 18 of 36 positions shown · IV contrast (omnipaque)
Comparison: Chest radiograph May 26, 2013

CLINICAL DATA: Abnormal chest radiograph; chest pain

EXAM:
CT ANGIOGRAPHY CHEST WITH CONTRAST
TECHNIQUE: Multidetector CT imaging of the chest was performed using the
standard protocol during bolus administration of intravenous
contrast. Multiplanar CT image reconstructions and MIPs were
obtained to evaluate the vascular anatomy.
CONTRAST:  80mL OMNIPAQUE IOHEXOL 350 MG/ML SOLN

[Series 5: pe thins · axial · 0.74mm/px · z∈[-278,-25]mm · 17 of 285 slices shown]
[im 16/285  lung]
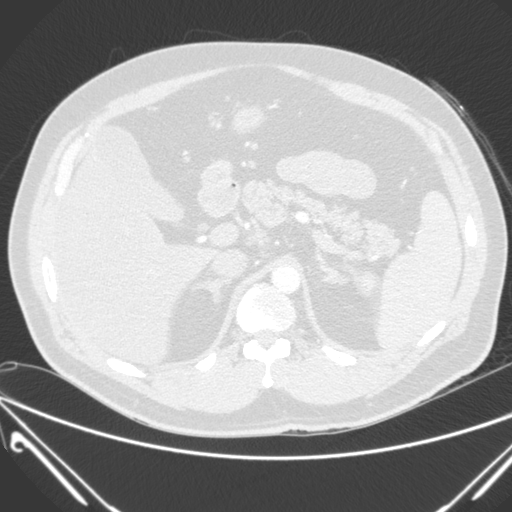
[im 32/285  mediastinal]
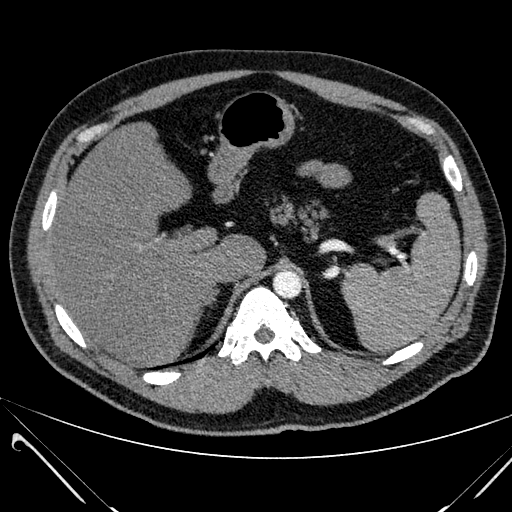
[im 48/285  lung]
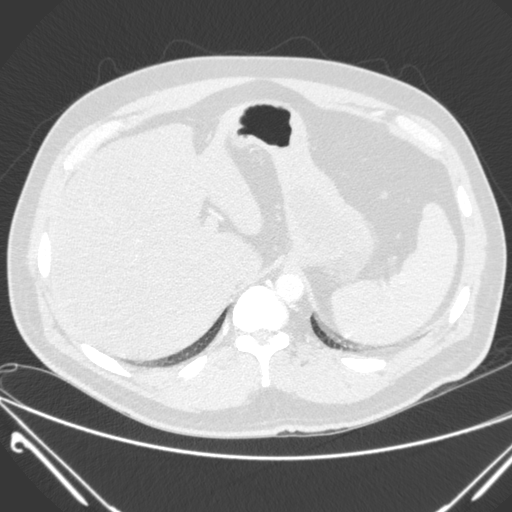
[im 64/285  mediastinal]
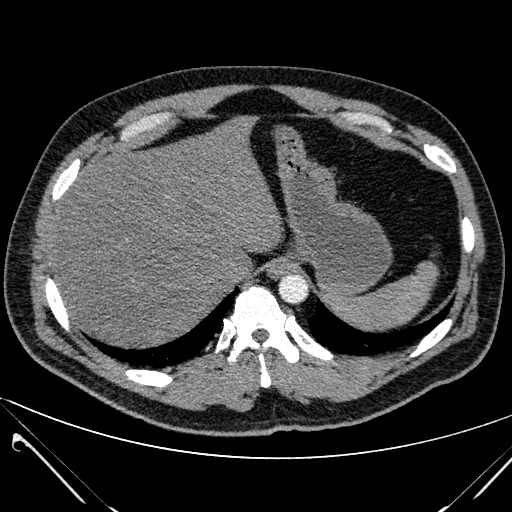
[im 79/285  lung]
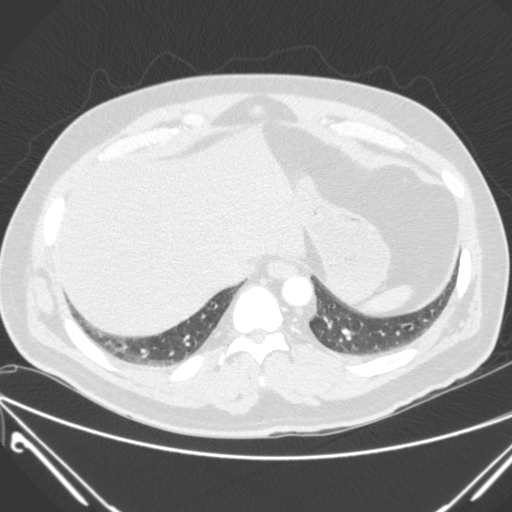
[im 95/285  mediastinal]
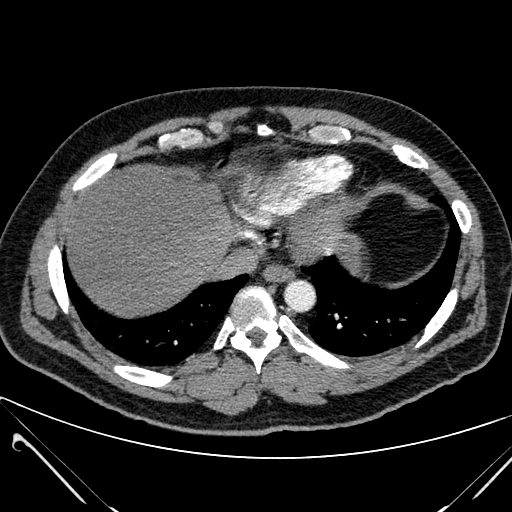
[im 111/285  lung]
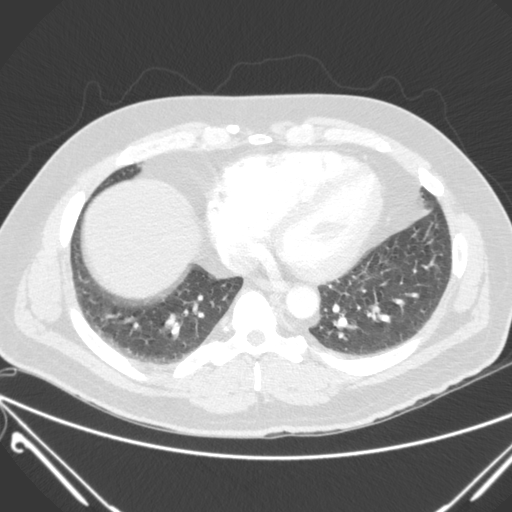
[im 127/285  mediastinal]
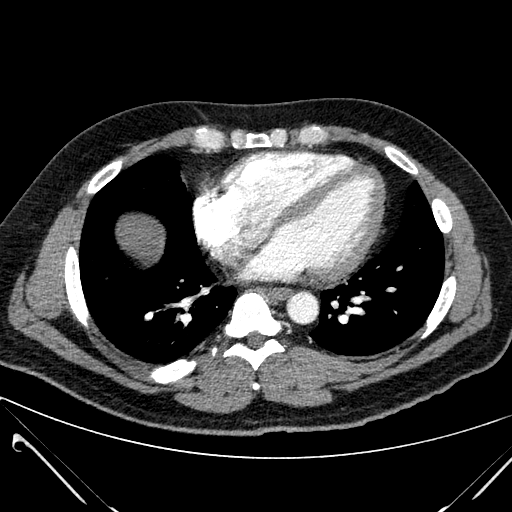
[im 143/285  lung]
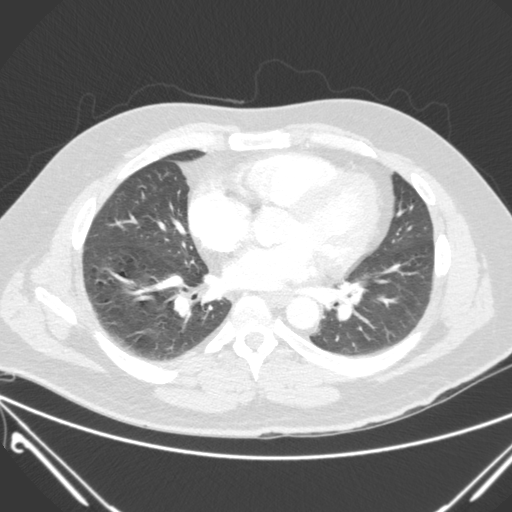
[im 158/285  mediastinal]
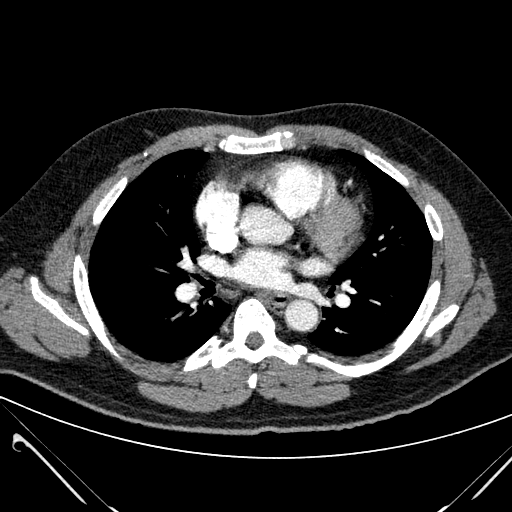
[im 174/285  lung]
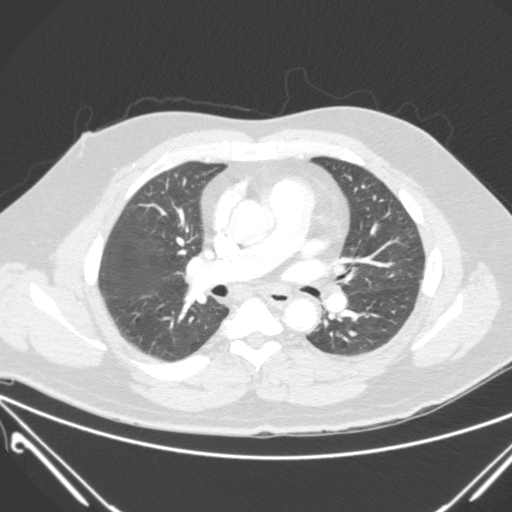
[im 190/285  mediastinal]
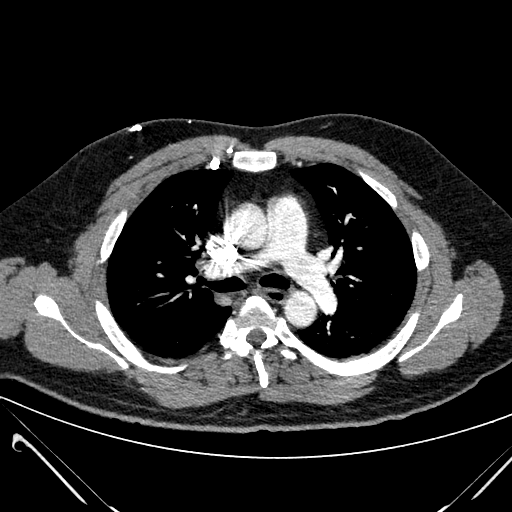
[im 206/285  lung]
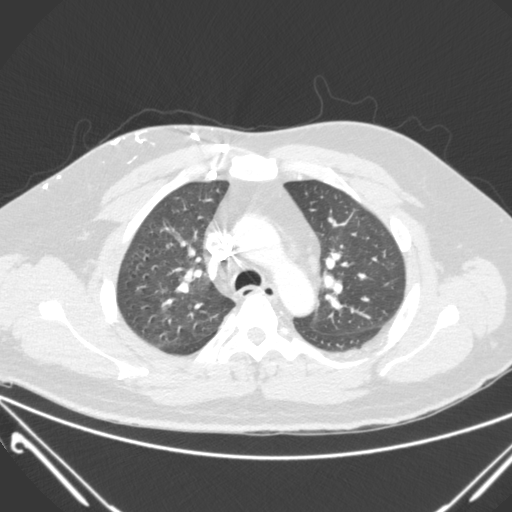
[im 221/285  mediastinal]
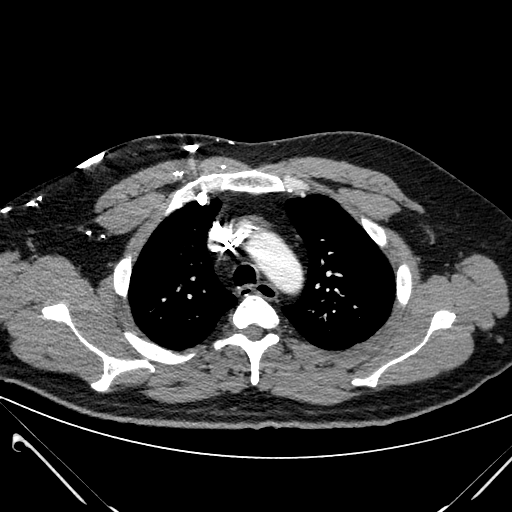
[im 237/285  lung]
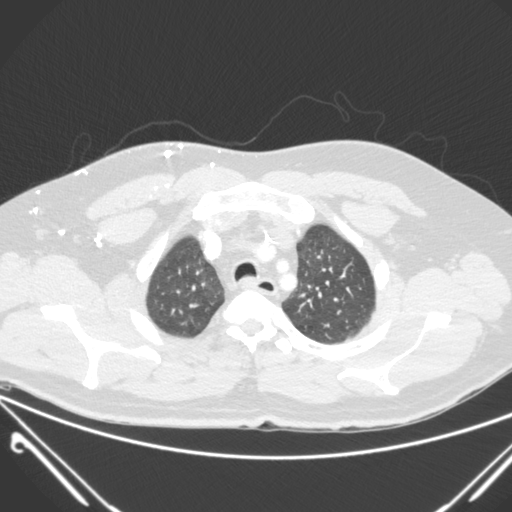
[im 253/285  mediastinal]
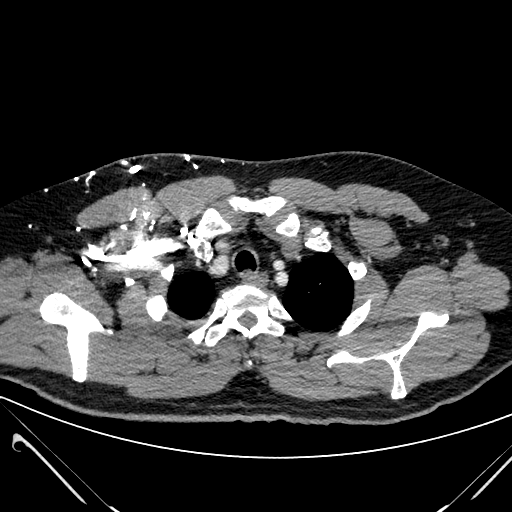
[im 269/285  lung]
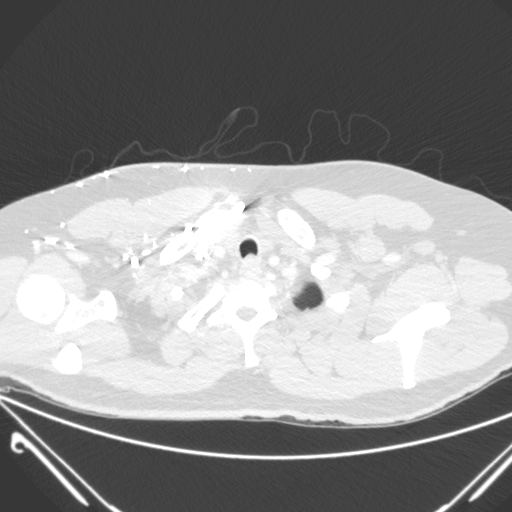

[Series 8050: mpr, coronals, coronal · coronal · 0.74mm/px · 1 of 126 slices shown]
[im 63/126  mediastinal]
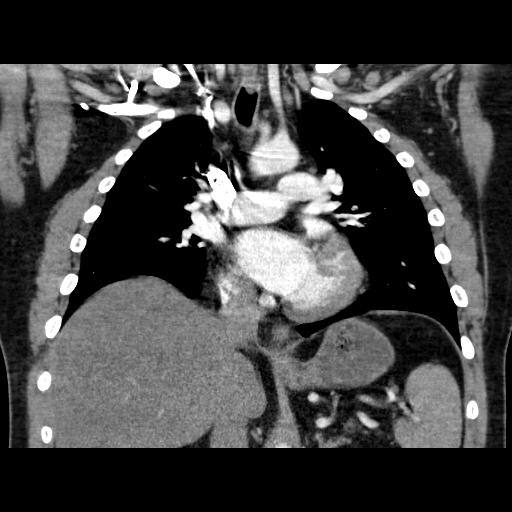

[18 of 36 positions shown; findings below may reference images not displayed]

FINDINGS: There is no demonstrable pulmonary embolus. There is no thoracic
aortic aneurysm or dissection.

There is no edema or consolidation. There is prominent mediastinal
fat along the anterior heart border or on both right and left sides.

There is no appreciable thoracic adenopathy. The pericardium is not
thickened.

In the visualized upper abdomen, there is fatty change in the liver.
There is incomplete visualization of an apparent cyst arising from
the upper pole the left kidney. There are no blastic or lytic bone
lesions.

Review of the MIP images confirms the above findings.
IMPRESSION: The opacity on recent chest radiograph is felt to represent vascular
prominence coupled with mediastinal fat. There is no edema or
consolidation. No demonstrable pulmonary embolus. There is evidence
of fatty change in the liver.

## 2021-05-27 ENCOUNTER — Other Ambulatory Visit: Payer: Self-pay

## 2021-05-27 ENCOUNTER — Emergency Department (HOSPITAL_COMMUNITY)
Admission: EM | Admit: 2021-05-27 | Discharge: 2021-05-27 | Disposition: A | Discharge status: Home / Self Care | Payer: Medicare Other | Attending: Emergency Medicine | Admitting: Emergency Medicine

## 2021-05-27 ENCOUNTER — Encounter (HOSPITAL_COMMUNITY): Payer: Self-pay

## 2021-05-27 ENCOUNTER — Emergency Department (HOSPITAL_COMMUNITY): Payer: Medicare Other

## 2021-05-27 DIAGNOSIS — I10 Essential (primary) hypertension: Secondary | ICD-10-CM | POA: Diagnosis not present

## 2021-05-27 DIAGNOSIS — R0789 Other chest pain: Secondary | ICD-10-CM | POA: Insufficient documentation

## 2021-05-27 DIAGNOSIS — E039 Hypothyroidism, unspecified: Secondary | ICD-10-CM | POA: Insufficient documentation

## 2021-05-27 DIAGNOSIS — Z79899 Other long term (current) drug therapy: Secondary | ICD-10-CM | POA: Diagnosis not present

## 2021-05-27 DIAGNOSIS — R079 Chest pain, unspecified: Secondary | ICD-10-CM

## 2021-05-27 DIAGNOSIS — Z87891 Personal history of nicotine dependence: Secondary | ICD-10-CM | POA: Insufficient documentation

## 2021-05-27 HISTORY — DX: Essential (primary) hypertension: I10

## 2021-05-27 LAB — BASIC METABOLIC PANEL
Anion gap: 13 (ref 5–15)
BUN: 12 mg/dL (ref 8–23)
CO2: 21 mmol/L — ABNORMAL LOW (ref 22–32)
Calcium: 9 mg/dL (ref 8.9–10.3)
Chloride: 106 mmol/L (ref 98–111)
Creatinine, Ser: 1.05 mg/dL (ref 0.61–1.24)
GFR, Estimated: >60 mL/min (ref >60)
Glucose, Bld: 91 mg/dL (ref 70–99)
Potassium: 4.1 mmol/L (ref 3.5–5.1)
Sodium: 140 mmol/L (ref 135–145)

## 2021-05-27 LAB — TROPONIN I (HIGH SENSITIVITY)
Troponin I (High Sensitivity): 4 ng/L (ref <18)
Troponin I (High Sensitivity): 4 ng/L (ref <18)

## 2021-05-27 LAB — CBC WITH DIFFERENTIAL/PLATELET
Abs Immature Granulocytes: 0.02 10*3/uL (ref 0.00–0.07)
Basophils Absolute: 0 10*3/uL (ref 0.0–0.1)
Basophils Relative: 0 %
Eosinophils Absolute: 0.1 10*3/uL (ref 0.0–0.5)
Eosinophils Relative: 2 %
HCT: 43.5 % (ref 39.0–52.0)
Hemoglobin: 14.7 g/dL (ref 13.0–17.0)
Immature Granulocytes: 0 %
Lymphocytes Relative: 31 %
Lymphs Abs: 2.3 10*3/uL (ref 0.7–4.0)
MCH: 30.5 pg (ref 26.0–34.0)
MCHC: 33.8 g/dL (ref 30.0–36.0)
MCV: 90.2 fL (ref 80.0–100.0)
Monocytes Absolute: 0.4 10*3/uL (ref 0.1–1.0)
Monocytes Relative: 6 %
Neutro Abs: 4.6 10*3/uL (ref 1.7–7.7)
Neutrophils Relative %: 61 %
Platelets: 212 10*3/uL (ref 150–400)
RBC: 4.82 MIL/uL (ref 4.22–5.81)
RDW: 12.8 % (ref 11.5–15.5)
WBC: 7.6 10*3/uL (ref 4.0–10.5)
nRBC: 0 % (ref 0.0–0.2)

## 2021-05-27 MED ORDER — METHOCARBAMOL 500 MG PO TABS: 500.0000 mg | ORAL_TABLET | Freq: Two times a day (BID) | ORAL | 0 refills | Status: AC

## 2021-05-27 NOTE — ED Provider Notes (Signed)
?[facility] EMERGENCY DEPARTMENT ?Provider Note ? ? ?CSN: [id number] ?Arrival date & time: 05/27/21  1219 ? ?  ? ?History ? ?Chief Complaint  ?Patient presents with  ? Chest Pain  ? ? ?DATRELL DUNTON is a 66 y.o. male with PMHx HTN and hypothyroidism who presents to the ED today with complaint of sudden onset, intermittent, left sided chest "spasms" that started last night. Pt reports they last a couple of seconds each before dissipating. He last had the chest pain about 2:30 PM today - had 4 individual episodes. Denies any other associated symptoms including SOB, nausea, vomiting, diaphoresis, leg swelling, or radiation of pain. Denies hx of CAD. No significant Fhx < age 24. Pt is a former smoker - quit 21 years ago. Denies hx DVT/PE. No recent prolonged travel or immobilization. No hemoptysis. No active malignancy. No exogenous hormone use.  ? ?The history is provided by the patient, medical records and the spouse.  ? ?HPI: A 30 year old patient with a history of hypertension presents for evaluation of chest pain. Initial onset of pain was approximately 3-6 hours ago. The patient's chest pain is not worse with exertion. The patient's chest pain is middle- or left-sided, is not well-localized, is not described as heaviness/pressure/tightness, is not sharp and does not radiate to the arms/jaw/neck. The patient does not complain of nausea and denies diaphoresis. The patient has no history of stroke, has no history of peripheral artery disease, has not smoked in the past 90 days, denies any history of treated diabetes, has no relevant family history of coronary artery disease (first degree relative at less than age 33), has no history of hypercholesterolemia and does not have an elevated BMI (>=30).  ? ?Home Medications ?Prior to Admission medications   ?Medication Sig Start Date End Date Taking? Authorizing Provider  ?methocarbamol (ROBAXIN) 500 MG tablet Take 1 tablet (500 mg total) by mouth 2 (two)  times daily. 05/27/21  Yes Jahnia Hewes, PA-C  ?levothyroxine (SYNTHROID, LEVOTHROID) 125 MCG tablet Take 125 mcg by mouth daily.    [provider]  ?ondansetron (ZOFRAN) 8 MG tablet Take 1 tablet (8 mg total) by mouth every 8 (eight) hours as needed for nausea. 05/28/12   Molpus, John, MD  ?promethazine (PHENERGAN) 25 MG tablet Take 1 tablet (25 mg total) by mouth every 6 (six) hours as needed for nausea or vomiting. 05/26/13   Coolidge Breeze, MD  ?   ? ?Allergies    ?Nyquil [pseudoeph-doxylamine-dm-apap], Ampicillin, and Penicillins   ? ?Review of Systems   ?Review of Systems  ?Constitutional:  Negative for chills and fever.  ?Respiratory:  Negative for shortness of breath.   ?Cardiovascular:  Positive for chest pain. Negative for palpitations and leg swelling.  ?Gastrointestinal:  Negative for nausea and vomiting.  ?Musculoskeletal:  Negative for arthralgias.  ?All other systems reviewed and are negative. ? ?Physical Exam ?Updated Vital Signs ?BP 134/73   Pulse (!) 58   Temp 98.2 ?F (36.8 ?C) (Oral)   Resp 20   SpO2 98%  ?Physical Exam ?Vitals and nursing note reviewed.  ?Constitutional:   ?   Appearance: He is not ill-appearing or diaphoretic.  ?HENT:  ?   Head: Normocephalic and atraumatic.  ?Eyes:  ?   Conjunctiva/sclera: Conjunctivae normal.  ?Cardiovascular:  ?   Rate and Rhythm: Normal rate and regular rhythm.  ?   Pulses:     ?     Radial pulses are 2+ on the right side and  2+ on the left side.  ?   Heart sounds: Normal heart sounds.  ?Pulmonary:  ?   Effort: Pulmonary effort is normal.  ?   Breath sounds: Normal breath sounds. No decreased breath sounds, wheezing, rhonchi or rales.  ?Abdominal:  ?   Palpations: Abdomen is soft.  ?   Tenderness: There is no abdominal tenderness.  ?Musculoskeletal:  ?   Cervical back: Neck supple.  ?   Right lower leg: No edema.  ?   Left lower leg: No edema.  ?Skin: ?   General: Skin is warm and dry.  ?Neurological:  ?   Mental Status: He is alert.   ? ? ?ED Results / Procedures / Treatments   ?Labs ?(all labs ordered are listed, but only abnormal results are displayed) ?Labs Reviewed  ?BASIC METABOLIC PANEL - Abnormal; Notable for the following components:  ?    Result Value  ? CO2 21 (*)   ? All other components within normal limits  ?CBC WITH DIFFERENTIAL/PLATELET  ?TROPONIN I (HIGH SENSITIVITY)  ?TROPONIN I (HIGH SENSITIVITY)  ? ? ?EKG ?EKG Interpretation ? ?Date/Time:  Saturday May 27 2021 12:29:08 EDT ?Ventricular Rate:  63 ?PR Interval:  144 ?QRS Duration: 84 ?QT Interval:  416 ?QTC Calculation: 425 ?R Axis:   54 ?Text Interpretation: Normal sinus rhythm Normal ECG When compared with ECG of 26-May-2013 06:18, PREVIOUS ECG IS PRESENT Confirmed by Eber HongMiller, Brian ([id number]) on 05/27/2021 3:52:48 PM ? ?Radiology ?DG Chest Port 1 View ? ?Result Date: 05/27/2021 ?CLINICAL DATA:  Chest pain.  Left-sided pain. EXAM: PORTABLE CHEST 1 VIEW COMPARISON:  Chest radiograph and CT 05/26/2013 FINDINGS: The cardiomediastinal contours are normal. The lungs are clear. Pulmonary vasculature is normal. No consolidation, pleural effusion, or pneumothorax. No acute osseous abnormalities are seen. IMPRESSION: No acute chest findings. Electronically Signed   By: Narda RutherfordMelanie  Sanford M.D.   On: 05/27/2021 16:47   ? ?Procedures ?Procedures  ? ? ?Medications Ordered in ED ?Medications - No data to display ? ?ED Course/ Medical Decision Making/ A&P ?  ?HEAR Score: 2                       ?Medical Decision Making ?66-year-old male who presents to the ED today with complaint of intermittent left-sided chest pain that he describes as spasming in nature x1 day.  No other associated symptoms.  On arrival to the ED vitals are stable.  Patient is afebrile, nontachycardic and nontachypneic.  Initially heart rate of 63 however repeat vitals bradycardic at 56, no history of same.  She does have mild artifact, will plan to repeat.  Will work-up for ACS at this time.  Patient denies history of PE/DVT  and he is nontachycardic, nonhypoxic without additional risk factors.  Do not feel he requires D-dimer at this time as the only consideration would be his age.  Given overall comfortable appearance lower suspicion for aortic dissection.  Denies any heavy lifting to suggest musculoskeletal type pain however given intermittent spasming type pain more suspicious for same? If workup overall reassuring feel pt can be discharged home with PCP follow up.  ? ?Workup overall reassuring in the ED today. Troponins flat x 2. No other abnormalities on labs or CXR. EKG reassuring. Will plan to discharge pt home with PCP follow up. Given complaint of spasming will trial short course of muscle relaxer at home PRN. Pt and family are in agreement with plan and stable for discharge home.  ? ?Problems Addressed: ?  Nonspecific chest pain: acute illness or injury ? ?Amount and/or Complexity of Data Reviewed ?Labs: ordered. ?   Details: CBC without leukocytosis. Hgb stable at 14.7 ?BMP with bicarb 21. Glucose 91, no gap. No other electrolyte abnormalities.  ?Troponin of 4; will plan to repeat. ? ?Repeat troponin unchanged at 4 ?Radiology: ordered. ?   Details: CXR viewed by myself; no acute cardiopulmonary abnormalities appreciated. Confirmed by radiologist. ?ECG/medicine tests: ordered. Decision-making details documented in ED Course. ?   Details: Repeat EKG without acute ischemic changes ? ? ? ? ? ? ? ? ? ?Final Clinical Impression(s) / ED Diagnoses ?Final diagnoses:  ?Nonspecific chest pain  ? ? ?Rx / DC Orders ?ED Discharge Orders   ? ?      Ordered  ?  methocarbamol (ROBAXIN) 500 MG tablet  2 times daily       ? 05/27/21 1902  ? ?  ?  ? ?  ? ? ? ?Discharge Instructions   ? ?  ?Your workup was overall reassuring in the ED today. Please pick up medication and take as needed for possibility of muscle spasms.  ? ?Follow up with your PCP for further evaluation.  ? ?Return to the ED IMMEDIATELY for any new/worsening symptoms including  worsening pain, pain with radiation to jaw/arm, shortness of breath, nausea/vomiting, excessive sweating, coughing up blood, or any other new/concerning symptoms ? ? ? ? ?  ?Tanda Rockers, PA-C ?05/27/21 1907 ? ?

## 2021-05-27 NOTE — ED Triage Notes (Signed)
Pt reports having L sided chest pain while at rest intermittently since yesterday. No associated SOB, N/V, diaphoresis. Pt states pain is resolved at this time.  ?

## 2021-05-27 NOTE — Discharge Instructions (Addendum)
Your workup was overall reassuring in the ED today. Please pick up medication and take as needed for possibility of muscle spasms.  ? ?Follow up with your PCP for further evaluation.  ? ?Return to the ED IMMEDIATELY for any new/worsening symptoms including worsening pain, pain with radiation to jaw/arm, shortness of breath, nausea/vomiting, excessive sweating, coughing up blood, or any other new/concerning symptoms ?

## 2023-06-03 IMAGING — DX DG CHEST 1V PORT
1 series · 1 of 1 positions shown · non-contrast
Comparison: Chest radiograph and CT 05/26/2013

CLINICAL DATA: Chest pain.  Left-sided pain.

EXAM:
PORTABLE CHEST 1 VIEW

[chest ap]
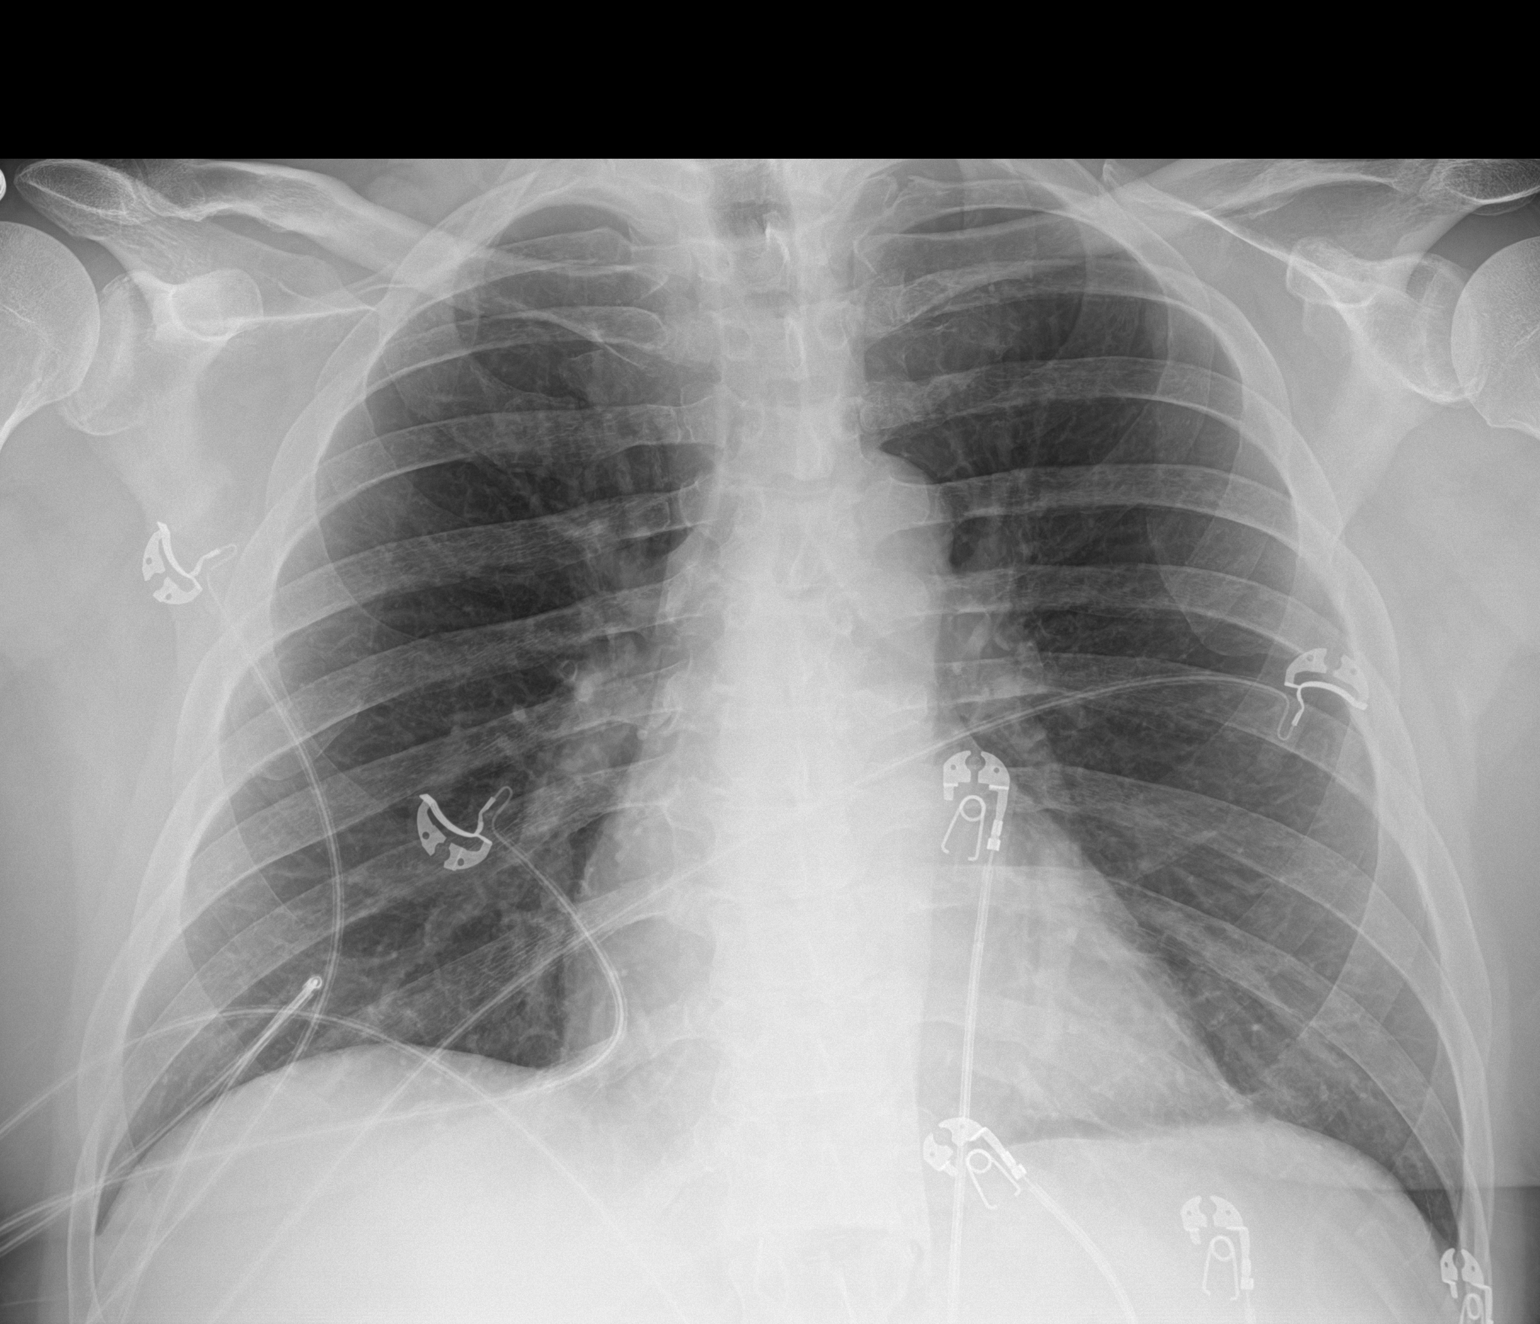

[1 of 1 positions shown; findings below may reference images not displayed]

FINDINGS: The cardiomediastinal contours are normal. The lungs are clear.
Pulmonary vasculature is normal. No consolidation, pleural effusion,
or pneumothorax. No acute osseous abnormalities are seen.
IMPRESSION: No acute chest findings.
